# Patient Record
Sex: Female | Born: 1960 | Race: White | Hispanic: No | Marital: Married | Smoking: Never smoker
Health system: Southern US, Community
[De-identification: ages and names within clinical notes are randomized; demographics above are authoritative.]

## PROBLEM LIST (undated history)

## (undated) DIAGNOSIS — J45909 Unspecified asthma, uncomplicated: Secondary | ICD-10-CM

## (undated) HISTORY — DX: Unspecified asthma, uncomplicated: J45.909

---

## 1998-08-04 ENCOUNTER — Ambulatory Visit (HOSPITAL_COMMUNITY): Admission: RE | Admit: 1998-08-04 | Discharge: 1998-08-04 | Payer: Self-pay | Admitting: Physician Assistant

## 1999-04-18 ENCOUNTER — Other Ambulatory Visit: Admission: RE | Admit: 1999-04-18 | Discharge: 1999-04-18 | Payer: Self-pay | Admitting: Obstetrics and Gynecology

## 1999-10-02 ENCOUNTER — Encounter: Payer: Self-pay | Admitting: Obstetrics and Gynecology

## 1999-10-02 ENCOUNTER — Inpatient Hospital Stay (HOSPITAL_COMMUNITY): Admission: AD | Admit: 1999-10-02 | Discharge: 1999-10-02 | Payer: Self-pay | Admitting: Obstetrics and Gynecology

## 1999-10-20 ENCOUNTER — Inpatient Hospital Stay (HOSPITAL_COMMUNITY): Admission: AD | Admit: 1999-10-20 | Discharge: 1999-10-22 | Payer: Self-pay | Admitting: Obstetrics and Gynecology

## 1999-10-24 ENCOUNTER — Encounter: Admission: RE | Admit: 1999-10-24 | Discharge: 2000-01-22 | Payer: Self-pay | Admitting: Obstetrics and Gynecology

## 1999-12-06 ENCOUNTER — Other Ambulatory Visit: Admission: RE | Admit: 1999-12-06 | Discharge: 1999-12-06 | Payer: Self-pay | Admitting: Obstetrics and Gynecology

## 2000-11-22 ENCOUNTER — Ambulatory Visit (HOSPITAL_COMMUNITY): Admission: RE | Admit: 2000-11-22 | Discharge: 2000-11-22 | Payer: Self-pay | Admitting: Family Medicine

## 2000-11-22 ENCOUNTER — Encounter: Payer: Self-pay | Admitting: Family Medicine

## 2002-06-11 ENCOUNTER — Ambulatory Visit (HOSPITAL_COMMUNITY): Admission: RE | Admit: 2002-06-11 | Discharge: 2002-06-11 | Payer: Self-pay | Admitting: Family Medicine

## 2002-06-11 ENCOUNTER — Encounter: Payer: Self-pay | Admitting: Family Medicine

## 2003-06-16 ENCOUNTER — Ambulatory Visit (HOSPITAL_COMMUNITY): Admission: RE | Admit: 2003-06-16 | Discharge: 2003-06-16 | Payer: Self-pay | Admitting: Family Medicine

## 2003-08-04 ENCOUNTER — Other Ambulatory Visit: Admission: RE | Admit: 2003-08-04 | Discharge: 2003-08-04 | Payer: Self-pay | Admitting: Obstetrics and Gynecology

## 2004-08-26 ENCOUNTER — Ambulatory Visit (HOSPITAL_COMMUNITY): Admission: RE | Admit: 2004-08-26 | Discharge: 2004-08-26 | Payer: Self-pay | Admitting: Family Medicine

## 2004-10-14 ENCOUNTER — Other Ambulatory Visit: Admission: RE | Admit: 2004-10-14 | Discharge: 2004-10-14 | Payer: Self-pay | Admitting: Obstetrics and Gynecology

## 2005-10-17 ENCOUNTER — Other Ambulatory Visit: Admission: RE | Admit: 2005-10-17 | Discharge: 2005-10-17 | Payer: Self-pay | Admitting: Obstetrics and Gynecology

## 2007-10-03 IMAGING — US MAMMO-LUNI-US
1 series · 12 of 12 positions shown · non-contrast
Comparison: NONE

CLINICAL DATA: PUENAYAN, RT(R)(M)(CT)  Diagnostic 
mammogram.  

LEFT BREAST MAMMOGRAM (ADDITIONAL VIEWS) AND LEFT BREAST 
ULTRASOUND

[Series 1: us breast · 0.06mm/px · 12 of 12 slices shown]
[im 1/12]
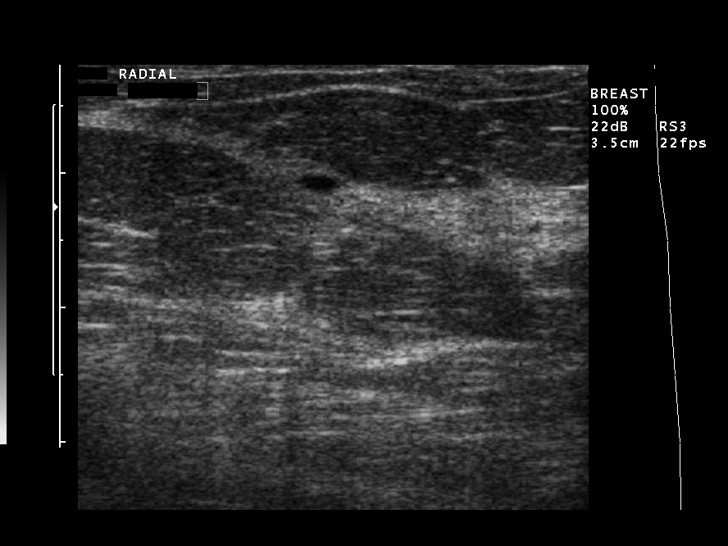
[im 2/12]
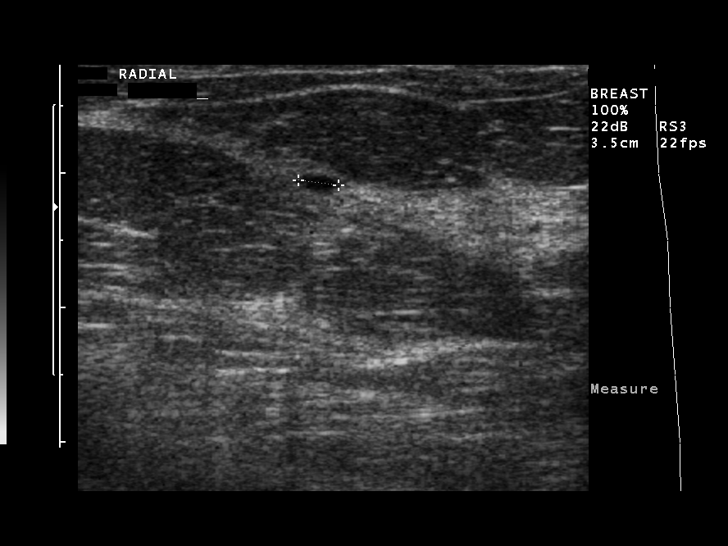
[im 3/12]
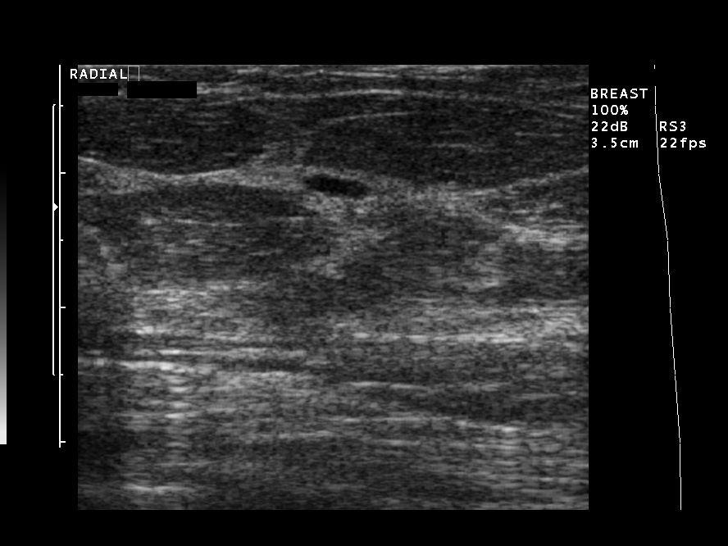
[im 4/12]
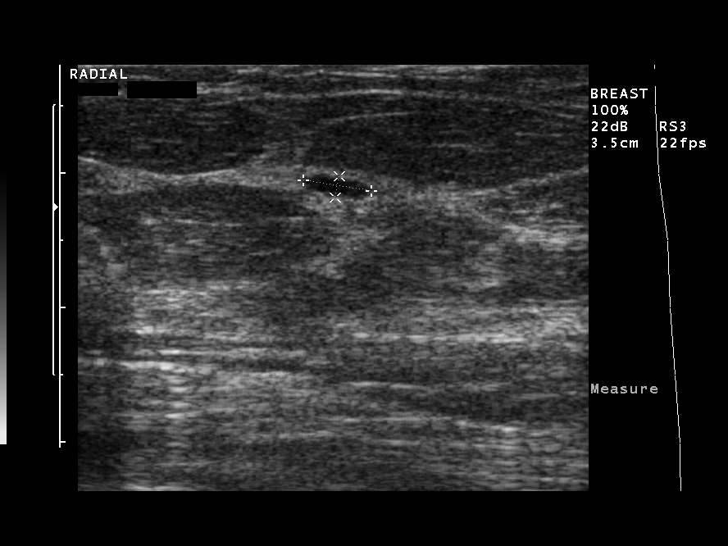
[im 5/12]
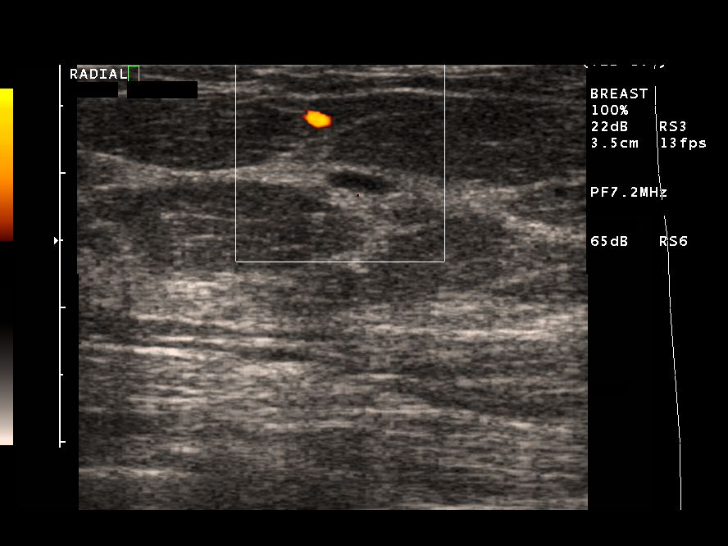
[im 6/12]
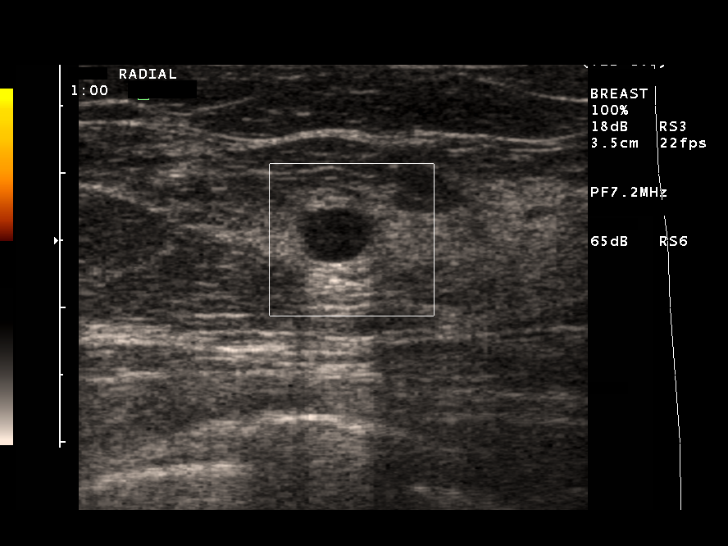
[im 7/12]
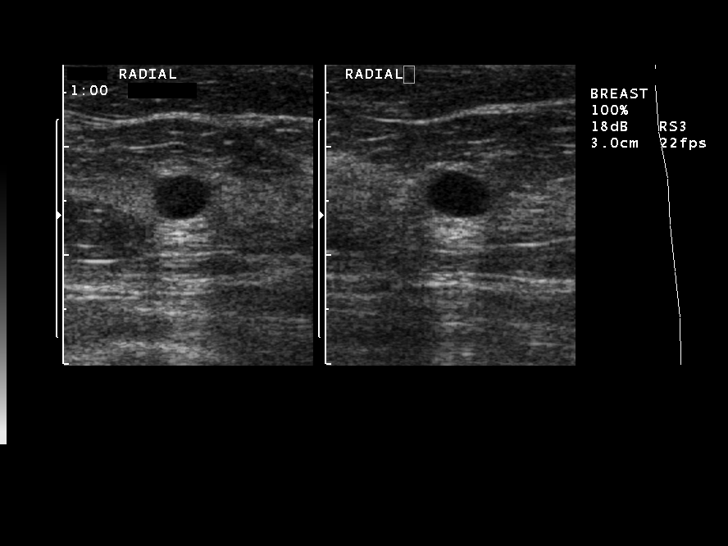
[im 8/12]
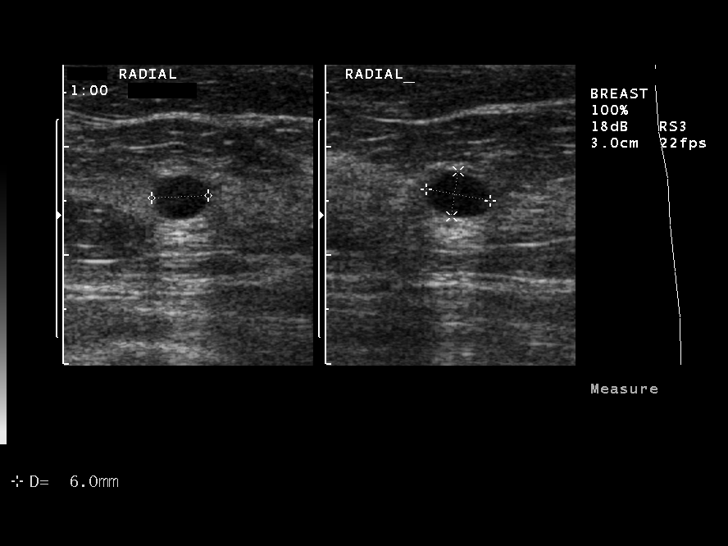
[im 9/12]
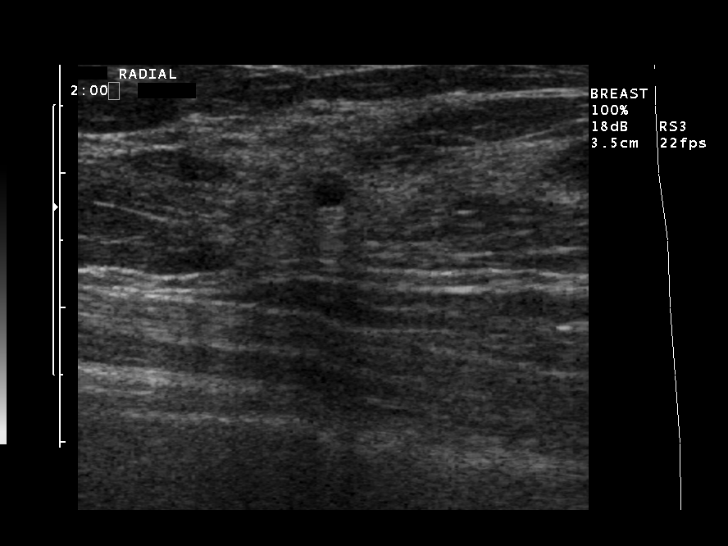
[im 10/12]
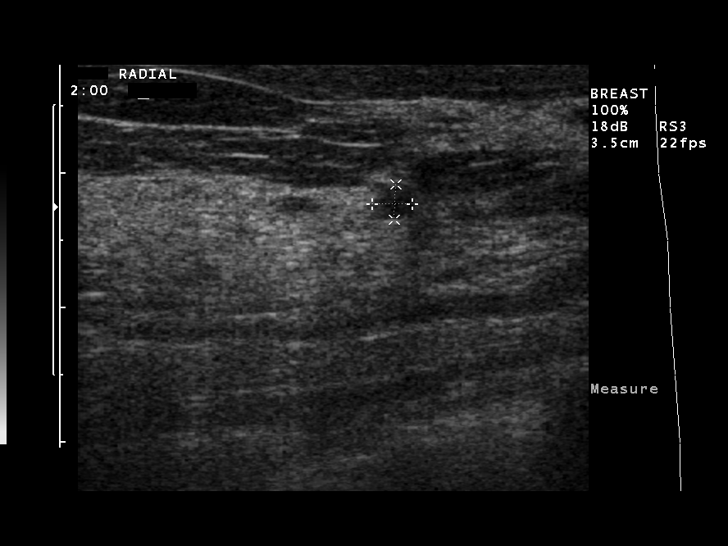
[im 11/12]
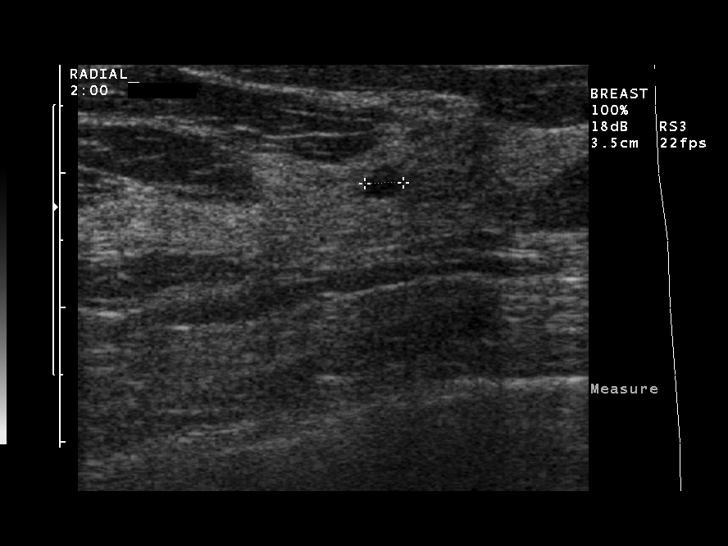
[im 12/12]
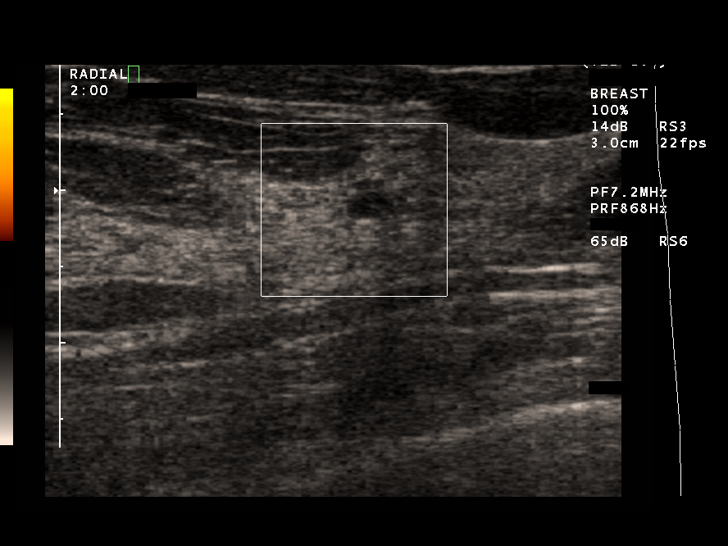

[12 of 12 positions shown; findings below may reference images not displayed]

FINDINGS: Repeat left MLO view and true lateral view of the left 
breast demonstrate persistent faint nodular density in the upper 
quadrant of the left breast. This area is not clearly visualized 
in the CC view but is most likely lateral. Left breast ultrasound 
demonstrates a cyst in the 1 o???clock position, subcentimeter in 
size. This likely corresponds to the mammographic density. No 
other findings.
IMPRESSION: Benign-appearing changes in the left breast. 
Bilateral mammogram in 1 year is recommended. The patient was 
informed at the time of the examination of the findings and 
recommendations by verbal and written lay report. Computer 
assisted (Second Look) technology was used as an aid in 
interpretation of this study. BI-RADS 2- Benign PUENAYAN Date:  [DATE] DAS  [REDACTED]

## 2010-10-06 ENCOUNTER — Other Ambulatory Visit: Payer: Self-pay | Admitting: Obstetrics and Gynecology

## 2010-10-13 ENCOUNTER — Other Ambulatory Visit: Payer: Self-pay | Admitting: Obstetrics and Gynecology

## 2010-10-13 ENCOUNTER — Ambulatory Visit
Admission: RE | Admit: 2010-10-13 | Discharge: 2010-10-13 | Disposition: A | Discharge status: Home / Self Care | Payer: BC Managed Care – PPO | Source: Ambulatory Visit | Attending: Obstetrics and Gynecology | Admitting: Obstetrics and Gynecology

## 2011-09-25 ENCOUNTER — Other Ambulatory Visit: Payer: Self-pay | Admitting: Obstetrics and Gynecology

## 2011-09-25 DIAGNOSIS — Z1231 Encounter for screening mammogram for malignant neoplasm of breast: Secondary | ICD-10-CM

## 2011-10-17 ENCOUNTER — Ambulatory Visit: Payer: BC Managed Care – PPO

## 2013-03-06 ENCOUNTER — Ambulatory Visit (INDEPENDENT_AMBULATORY_CARE_PROVIDER_SITE_OTHER): Payer: BC Managed Care – PPO | Admitting: Internal Medicine

## 2013-03-06 ENCOUNTER — Encounter: Payer: Self-pay | Admitting: Internal Medicine

## 2013-03-06 VITALS — BP 110/72 | HR 55 | Temp 98.2°F | Ht <= 58 in | Wt 144.0 lb

## 2013-03-06 DIAGNOSIS — R059 Cough, unspecified: Secondary | ICD-10-CM | POA: Insufficient documentation

## 2013-03-06 DIAGNOSIS — R05 Cough: Secondary | ICD-10-CM

## 2013-03-06 MED ORDER — PANTOPRAZOLE SODIUM 40 MG PO TBEC: 40.0000 mg | DELAYED_RELEASE_TABLET | Freq: Every day | ORAL | Status: DC

## 2013-03-06 MED ORDER — BENZONATATE 200 MG PO CAPS: ORAL_CAPSULE | ORAL | Status: DC

## 2013-03-06 NOTE — Progress Notes (Signed)
  Subjective:    Patient ID: Beth Villanueva, female    DOB: 1961/01/31  MRN: [id number]  HPI  52 yo arabic female never smoker  Born in  [place] but not exp to bad air moved to [place] where in 1993 dx with asthma intermittently with wheezing and chest tightness  and continued to have similar symptoms p arrival in [place] in 1994 mostly with cats and pollen never eval by specilast rx with saba/singulair referred to pulmonary clinic 03/06/2013 by Dr Laurann Montana for intermittent refractory cough since 05/2012   03/06/2013 1st pulmonary ov / Beth Villanueva cc cough x 2 months fall of 2013 no resp to pred ? 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  Uses proair 2-3 x per week for chest tightness but no help for the cough which is dry any almost entirely daytime and not assoc with sob or wheeze.  Can cough so hard begins having midline  Ant cp resolves when stops coughing.   Already on singulair, inhalers and even prednisone not helpful.   No obvious other pattern to daytime variabilty or assoc overt sinus or hb symptoms. No unusual exp hx or h/o childhood pna/ asthma or knowledge of premature birth.   Sleeping ok without nocturnal  or early am exacerbation  of respiratory  c/o's or need for noct saba. Also denies any obvious fluctuation of symptoms with weather or environmental changes or other aggravating or alleviating factors except as outlined above   Review of Systems  Constitutional: Negative for fever, chills and unexpected weight change.  HENT: Negative for ear pain, nosebleeds, congestion, sore throat, rhinorrhea, sneezing, trouble swallowing, dental problem, voice change, postnasal drip and sinus pressure.   Eyes: Negative for visual disturbance.  Respiratory: Positive for cough. Negative for choking and shortness of breath.   Cardiovascular: Negative for chest pain and leg swelling.   Gastrointestinal: Negative for vomiting, abdominal pain and diarrhea.  Genitourinary: Negative for difficulty urinating.  Musculoskeletal: Negative for arthralgias.  Skin: Negative for rash.  Neurological: Negative for tremors, syncope and headaches.  Hematological: Does not bruise/bleed easily.       Objective:   Physical Exam Wt Readings from Last 3 Encounters:  03/06/13 144 lb (65.318 kg)    amb arabic female nad HEENT: nl dentition, turbinates, and orophanx. Nl external ear canals with cough reflex   NECK :  without JVD/Nodes/TM/ nl carotid upstrokes bilaterally   LUNGS: no acc muscle use, clear to A and P bilaterally without cough on insp or exp maneuvers   CV:  RRR  no s3 or murmur or increase in P2, no edema   ABD:  soft and nontender with nl excursion in the supine position. No bruits or organomegaly, bowel sounds nl  MS:  warm without deformities, calf tenderness, cyanosis or clubbing  SKIN: warm and dry without lesions    NEURO:  alert, approp, no deficits   cxr > none on file      Assessment & Plan:

## 2013-03-06 NOTE — Patient Instructions (Addendum)
Pantoprazole (protonix) 40 mg   Take 30-60 min before first meal of the day for at least a month  For cough use tessilon 200 mg every 4 hours   GERD (REFLUX)  is an extremely common cause of respiratory symptoms, many times with no significant heartburn at all.    It can be treated with medication, but also with lifestyle changes including avoidance of late meals, excessive alcohol, smoking cessation, and avoid fatty foods, chocolate, peppermint, colas, red wine, and acidic juices such as orange juice.  NO MINT OR MENTHOL PRODUCTS SO NO COUGH DROPS  USE SUGARLESS CANDY INSTEAD (jolley ranchers or Stover's)  NO OIL BASED VITAMINS - use powdered substitutes.  If not 100% better in one month we need to see you back for cxr

## 2013-03-08 NOTE — Assessment & Plan Note (Addendum)
The most common causes of chronic cough in immunocompetent adults include the following: upper airway cough syndrome (UACS), previously referred to as postnasal drip syndrome (PNDS), which is caused by variety of rhinosinus conditions; (2) asthma; (3) GERD; (4) chronic bronchitis from cigarette smoking or other inhaled environmental irritants; (5) nonasthmatic eosinophilic bronchitis; and (6) bronchiectasis.   These conditions, singly or in combination, have accounted for up to 94% of the causes of chronic cough in prospective studies.   Other conditions have constituted no >6% of the causes in prospective studies These have included bronchogenic carcinoma, chronic interstitial pneumonia, sarcoidosis, left ventricular failure, ACEI-induced cough, and aspiration from a condition associated with pharyngeal dysfunction.    Chronic cough is often simultaneously caused by more than one condition. A single cause has been found from 38 to 82% of the time, multiple causes from 18 to 62%. Multiply caused cough has been the result of three diseases up to 42% of the time.       This is almost certainly  Classic Upper airway cough syndrome, so named because it's frequently impossible to sort out how much is  CR/sinusitis with freq throat clearing (which can be related to primary GERD)   vs  causing  secondary (" extra esophageal")  GERD from wide swings in gastric pressure that occur with throat clearing, often  promoting self use of mint and menthol lozenges that reduce the lower esophageal sphincter tone and exacerbate the problem further in a cyclical fashion.   These are the same pts (now being labeled as having "irritable larynx syndrome" by some cough centers) who not infrequently have a history of having failed to tolerate ace inhibitors,  dry powder inhalers or biphosphonates or report having atypical reflux symptoms that don't respond to standard doses of PPI , and are easily confused as having aecopd or  asthma flares by even experienced allergists/ pulmonologists.   For now try max gerd rx with both diet and ppi  x one month then regroup.

## 2013-11-06 ENCOUNTER — Other Ambulatory Visit: Payer: Self-pay

## 2013-11-06 DIAGNOSIS — Z1231 Encounter for screening mammogram for malignant neoplasm of breast: Secondary | ICD-10-CM

## 2013-11-25 ENCOUNTER — Ambulatory Visit
Admission: RE | Admit: 2013-11-25 | Discharge: 2013-11-25 | Disposition: A | Discharge status: Home / Self Care | Payer: BC Managed Care – PPO | Source: Ambulatory Visit

## 2013-11-25 DIAGNOSIS — Z1231 Encounter for screening mammogram for malignant neoplasm of breast: Secondary | ICD-10-CM

## 2014-12-08 ENCOUNTER — Other Ambulatory Visit: Payer: Self-pay

## 2014-12-08 DIAGNOSIS — Z1231 Encounter for screening mammogram for malignant neoplasm of breast: Secondary | ICD-10-CM

## 2014-12-10 ENCOUNTER — Ambulatory Visit
Admission: RE | Admit: 2014-12-10 | Discharge: 2014-12-10 | Disposition: A | Discharge status: Home / Self Care | Payer: BC Managed Care – PPO | Source: Ambulatory Visit

## 2014-12-10 DIAGNOSIS — Z1231 Encounter for screening mammogram for malignant neoplasm of breast: Secondary | ICD-10-CM

## 2015-03-16 ENCOUNTER — Other Ambulatory Visit (HOSPITAL_COMMUNITY)
Admission: RE | Admit: 2015-03-16 | Discharge: 2015-03-16 | Disposition: A | Discharge status: Home / Self Care | Payer: BC Managed Care – PPO | Source: Ambulatory Visit | Attending: Family Medicine | Admitting: Family Medicine

## 2015-03-16 ENCOUNTER — Other Ambulatory Visit: Payer: Self-pay | Admitting: Family Medicine

## 2015-03-16 DIAGNOSIS — Z01419 Encounter for gynecological examination (general) (routine) without abnormal findings: Secondary | ICD-10-CM | POA: Insufficient documentation

## 2015-03-18 LAB — CYTOLOGY - PAP

## 2015-11-18 ENCOUNTER — Ambulatory Visit (INDEPENDENT_AMBULATORY_CARE_PROVIDER_SITE_OTHER): Payer: BC Managed Care – PPO | Admitting: Podiatry

## 2015-11-18 ENCOUNTER — Other Ambulatory Visit: Payer: Self-pay

## 2015-11-18 ENCOUNTER — Encounter: Payer: Self-pay | Admitting: Podiatry

## 2015-11-18 VITALS — BP 128/80 | HR 58 | Ht <= 58 in | Wt 158.0 lb

## 2015-11-18 DIAGNOSIS — M205X9 Other deformities of toe(s) (acquired), unspecified foot: Secondary | ICD-10-CM | POA: Diagnosis not present

## 2015-11-18 DIAGNOSIS — Q828 Other specified congenital malformations of skin: Secondary | ICD-10-CM | POA: Diagnosis not present

## 2015-11-18 DIAGNOSIS — M205X1 Other deformities of toe(s) (acquired), right foot: Secondary | ICD-10-CM

## 2015-11-18 DIAGNOSIS — M659 Synovitis and tenosynovitis, unspecified: Secondary | ICD-10-CM

## 2015-11-18 DIAGNOSIS — L923 Foreign body granuloma of the skin and subcutaneous tissue: Secondary | ICD-10-CM

## 2015-11-18 DIAGNOSIS — M216X9 Other acquired deformities of unspecified foot: Secondary | ICD-10-CM

## 2015-11-18 DIAGNOSIS — M21969 Unspecified acquired deformity of unspecified lower leg: Secondary | ICD-10-CM | POA: Insufficient documentation

## 2015-11-18 NOTE — Patient Instructions (Signed)
Seen for pain under right foot with callus like lesion, which was debrided. Will monitor its progress. Left foot pain on top is due to weakened first Metatarsal bone with bunion. Metatarsal binder will help some for now. Will benefit from Custom orthotics. Both feet casted for Orthotics.

## 2015-11-18 NOTE — Progress Notes (Signed)
Lesion under the 2nd MPJ right for a few months.   Pain on top of left foot, tip of right, lesion under 2nd MPJ.  Debrided. Need Orthotics. Met binder dispensed for left. SUBJECTIVE: 55 y.o. year old female presents complaining of a painful lesion under the ball of right foot for a few months and numbness on tip of the left great toe.  Also having pain on top of left foot.  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   REVIEW OF SYSTEMS: A comprehensive review of systems was negative.  OBJECTIVE: DERMATOLOGIC EXAMINATION: Positive of porokeratotic lesion symptomatic under 2nd MPJ right foot. VASCULAR EXAMINATION OF LOWER LIMBS: Pedal pulses: All pedal pulses are palpable with normal pulsation. No visible edema or erythema noted over the affected area of dorsum left foot. NEUROLOGIC EXAMINATION OF THE LOWER LIMBS: All epicritic and tactile sensations grossly intact.  MUSCULOSKELETAL EXAMINATION: Positive for Hallux valgus with bunion on left. History of bunion surgery right foot.  Positive for excess sagittal plane motion of the first ray left. Positive of hallux limitus right upon loading of forefoot.  RADIOGRAPHIC STUDIES:  AP View:  Post bunionectomy done on right. Enlarged medial eminence of first metatarsal with hallux valgus on left. Lateral view:  Elevated first ray on left. Minimum elevation on right. Normal CYMA line and minimum deviation of CCJ bilateral.   ASSESSMENT: 1. Symptomatic porokeratosis sub 2 right, possible due to foreign body granuloma. 2. 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 3. Functional hallux limitus right, with numbness on top of right great toe. 4. Tenosynovitis midfoot left secondary to weak first ray.   PLAN: Reviewed clinical findings and available treatment options. Right foot lesion debrided. Left foot placed in Metatarsal binder with instruction. Both feet casted for orthotics.

## 2015-12-09 ENCOUNTER — Other Ambulatory Visit: Payer: Self-pay

## 2015-12-09 DIAGNOSIS — Z1231 Encounter for screening mammogram for malignant neoplasm of breast: Secondary | ICD-10-CM

## 2015-12-21 ENCOUNTER — Ambulatory Visit
Admission: RE | Admit: 2015-12-21 | Discharge: 2015-12-21 | Disposition: A | Discharge status: Home / Self Care | Payer: BC Managed Care – PPO | Source: Ambulatory Visit

## 2015-12-21 DIAGNOSIS — Z1231 Encounter for screening mammogram for malignant neoplasm of breast: Secondary | ICD-10-CM

## 2016-01-18 ENCOUNTER — Ambulatory Visit (INDEPENDENT_AMBULATORY_CARE_PROVIDER_SITE_OTHER): Payer: BC Managed Care – PPO | Admitting: Podiatry

## 2016-01-18 ENCOUNTER — Encounter: Payer: Self-pay | Admitting: Podiatry

## 2016-01-18 DIAGNOSIS — M79673 Pain in unspecified foot: Secondary | ICD-10-CM

## 2016-01-18 DIAGNOSIS — M79604 Pain in right leg: Secondary | ICD-10-CM

## 2016-01-18 DIAGNOSIS — Q828 Other specified congenital malformations of skin: Secondary | ICD-10-CM

## 2016-01-18 NOTE — Progress Notes (Signed)
Subjective: One month orthotic check. Orthotics are doing good. Pain in left foot dorsum is gone. Right foot heel has a lesion.   OBJECTIVE: DERMATOLOGIC EXAMINATION: Resolved pain from 2nd MPJ plantar lesion right foot. New pain from under the right plantar medial heel with porokeratotic lesion.  VASCULAR EXAMINATION OF LOWER LIMBS: Pedal pulses: All pedal pulses are palpable with normal pulsation. No visible edema or erythema noted over the affected area of dorsum left foot. NEUROLOGIC EXAMINATION OF THE LOWER LIMBS: All epicritic and tactile sensations grossly intact.  MUSCULOSKELETAL EXAMINATION: Positive for Hallux valgus with bunion on left. History of bunion surgery right foot.  Positive for excess sagittal plane motion of the first ray left. Positive of hallux limitus right upon loading of forefoot.  Previous X-ray study results are:  MV:HQIOAP:Post bunionectomy done on right. Enlarged medial eminence of first metatarsal with hallux valgus on left. Lateral view:  Elevated first ray on left. Minimum elevation on right. Normal CYMA line and minimum deviation of CCJ bilateral.   ASSESSMENT: 1. Symptomatic porokeratosis sub 2 right, resolved with debridement. 2. 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 3. Functional hallux limitus right, with numbness on top of right great toe. 4. Tenosynovitis midfoot left secondary to weak first ray, resolved through Orthotic treatment.  5. New painful porokeratotic lesion under right plantar medial heel.   Plan: Debrided painful lesion right heel.  Continue with Orthotic treatment.  Return as needed.

## 2016-01-18 NOTE — Patient Instructions (Signed)
Orthotic check and doing well. Has painful porokeratotic lesion on right heel. Debrided painful lesion. Return as needed.

## 2016-06-16 ENCOUNTER — Other Ambulatory Visit: Payer: Self-pay | Admitting: Obstetrics and Gynecology

## 2016-06-19 LAB — CYTOLOGY - PAP

## 2017-01-25 ENCOUNTER — Other Ambulatory Visit: Payer: Self-pay | Admitting: Obstetrics and Gynecology

## 2017-01-25 DIAGNOSIS — Z1231 Encounter for screening mammogram for malignant neoplasm of breast: Secondary | ICD-10-CM

## 2017-02-09 ENCOUNTER — Ambulatory Visit
Admission: RE | Admit: 2017-02-09 | Discharge: 2017-02-09 | Disposition: A | Discharge status: Home / Self Care | Payer: BC Managed Care – PPO | Source: Ambulatory Visit | Attending: Obstetrics and Gynecology | Admitting: Obstetrics and Gynecology

## 2017-02-09 DIAGNOSIS — Z1231 Encounter for screening mammogram for malignant neoplasm of breast: Secondary | ICD-10-CM

## 2017-12-31 ENCOUNTER — Other Ambulatory Visit: Payer: Self-pay | Admitting: Obstetrics and Gynecology

## 2017-12-31 DIAGNOSIS — Z1231 Encounter for screening mammogram for malignant neoplasm of breast: Secondary | ICD-10-CM

## 2018-02-11 ENCOUNTER — Ambulatory Visit
Admission: RE | Admit: 2018-02-11 | Discharge: 2018-02-11 | Disposition: A | Discharge status: Home / Self Care | Payer: BC Managed Care – PPO | Source: Ambulatory Visit | Attending: Obstetrics and Gynecology | Admitting: Obstetrics and Gynecology

## 2018-02-11 DIAGNOSIS — Z1231 Encounter for screening mammogram for malignant neoplasm of breast: Secondary | ICD-10-CM

## 2019-02-04 ENCOUNTER — Other Ambulatory Visit: Payer: Self-pay | Admitting: Obstetrics and Gynecology

## 2019-02-04 DIAGNOSIS — Z1231 Encounter for screening mammogram for malignant neoplasm of breast: Secondary | ICD-10-CM

## 2019-03-21 ENCOUNTER — Ambulatory Visit
Admission: RE | Admit: 2019-03-21 | Discharge: 2019-03-21 | Disposition: A | Discharge status: Home / Self Care | Payer: BC Managed Care – PPO | Source: Ambulatory Visit | Attending: Obstetrics and Gynecology | Admitting: Obstetrics and Gynecology

## 2019-03-21 ENCOUNTER — Other Ambulatory Visit: Payer: Self-pay

## 2019-03-21 ENCOUNTER — Other Ambulatory Visit: Payer: Self-pay | Admitting: Family Medicine

## 2019-03-21 DIAGNOSIS — M8580 Other specified disorders of bone density and structure, unspecified site: Secondary | ICD-10-CM

## 2019-03-21 DIAGNOSIS — Z1231 Encounter for screening mammogram for malignant neoplasm of breast: Secondary | ICD-10-CM

## 2019-06-13 ENCOUNTER — Ambulatory Visit
Admission: RE | Admit: 2019-06-13 | Discharge: 2019-06-13 | Disposition: A | Discharge status: Home / Self Care | Payer: BC Managed Care – PPO | Source: Ambulatory Visit | Attending: Family Medicine | Admitting: Family Medicine

## 2019-06-13 ENCOUNTER — Other Ambulatory Visit: Payer: Self-pay

## 2019-06-13 DIAGNOSIS — M8580 Other specified disorders of bone density and structure, unspecified site: Secondary | ICD-10-CM

## 2020-02-10 ENCOUNTER — Other Ambulatory Visit: Payer: Self-pay | Admitting: Obstetrics and Gynecology

## 2020-02-10 DIAGNOSIS — Z1231 Encounter for screening mammogram for malignant neoplasm of breast: Secondary | ICD-10-CM

## 2020-03-23 ENCOUNTER — Ambulatory Visit
Admission: RE | Admit: 2020-03-23 | Discharge: 2020-03-23 | Disposition: A | Discharge status: Home / Self Care | Payer: BC Managed Care – PPO | Source: Ambulatory Visit | Attending: Obstetrics and Gynecology | Admitting: Obstetrics and Gynecology

## 2020-03-23 ENCOUNTER — Other Ambulatory Visit: Payer: Self-pay

## 2020-03-23 DIAGNOSIS — Z1231 Encounter for screening mammogram for malignant neoplasm of breast: Secondary | ICD-10-CM

## 2021-02-10 ENCOUNTER — Other Ambulatory Visit: Payer: Self-pay | Admitting: Obstetrics and Gynecology

## 2021-02-10 DIAGNOSIS — Z1231 Encounter for screening mammogram for malignant neoplasm of breast: Secondary | ICD-10-CM

## 2021-03-29 ENCOUNTER — Other Ambulatory Visit: Payer: Self-pay | Admitting: Family Medicine

## 2021-03-29 DIAGNOSIS — M8580 Other specified disorders of bone density and structure, unspecified site: Secondary | ICD-10-CM

## 2021-04-05 ENCOUNTER — Ambulatory Visit
Admission: RE | Admit: 2021-04-05 | Discharge: 2021-04-05 | Disposition: A | Discharge status: Home / Self Care | Payer: BC Managed Care – PPO | Source: Ambulatory Visit | Attending: Obstetrics and Gynecology | Admitting: Obstetrics and Gynecology

## 2021-04-05 ENCOUNTER — Other Ambulatory Visit: Payer: Self-pay

## 2021-04-05 DIAGNOSIS — Z1231 Encounter for screening mammogram for malignant neoplasm of breast: Secondary | ICD-10-CM

## 2021-09-27 ENCOUNTER — Ambulatory Visit
Admission: RE | Admit: 2021-09-27 | Discharge: 2021-09-27 | Disposition: A | Discharge status: Home / Self Care | Payer: BC Managed Care – PPO | Source: Ambulatory Visit | Attending: Family Medicine | Admitting: Family Medicine

## 2021-09-27 DIAGNOSIS — M8580 Other specified disorders of bone density and structure, unspecified site: Secondary | ICD-10-CM

## 2021-12-22 ENCOUNTER — Other Ambulatory Visit: Payer: Self-pay | Admitting: Obstetrics and Gynecology

## 2021-12-22 DIAGNOSIS — Z1231 Encounter for screening mammogram for malignant neoplasm of breast: Secondary | ICD-10-CM

## 2022-04-10 ENCOUNTER — Ambulatory Visit
Admission: RE | Admit: 2022-04-10 | Discharge: 2022-04-10 | Disposition: A | Discharge status: Home / Self Care | Payer: BC Managed Care – PPO | Source: Ambulatory Visit | Attending: Obstetrics and Gynecology | Admitting: Obstetrics and Gynecology

## 2022-04-10 DIAGNOSIS — Z1231 Encounter for screening mammogram for malignant neoplasm of breast: Secondary | ICD-10-CM

## 2022-09-27 ENCOUNTER — Ambulatory Visit
Admission: RE | Admit: 2022-09-27 | Discharge: 2022-09-27 | Disposition: A | Discharge status: Home / Self Care | Payer: BC Managed Care – PPO | Source: Ambulatory Visit | Attending: Family Medicine | Admitting: Family Medicine

## 2022-09-27 ENCOUNTER — Other Ambulatory Visit: Payer: Self-pay | Admitting: Family Medicine

## 2022-09-27 DIAGNOSIS — R052 Subacute cough: Secondary | ICD-10-CM

## 2022-09-29 ENCOUNTER — Ambulatory Visit
Admission: RE | Admit: 2022-09-29 | Discharge: 2022-09-29 | Disposition: A | Discharge status: Home / Self Care | Payer: BC Managed Care – PPO | Source: Ambulatory Visit | Attending: Family Medicine | Admitting: Family Medicine

## 2024-03-07 ENCOUNTER — Encounter: Payer: Self-pay | Admitting: Advanced Practice Midwife

## 2024-05-20 ENCOUNTER — Other Ambulatory Visit (HOSPITAL_BASED_OUTPATIENT_CLINIC_OR_DEPARTMENT_OTHER): Payer: Self-pay | Admitting: Family Medicine

## 2024-05-20 DIAGNOSIS — M8580 Other specified disorders of bone density and structure, unspecified site: Secondary | ICD-10-CM

## 2024-06-23 ENCOUNTER — Ambulatory Visit (HOSPITAL_BASED_OUTPATIENT_CLINIC_OR_DEPARTMENT_OTHER)
Admission: RE | Admit: 2024-06-23 | Discharge: 2024-06-23 | Disposition: A | Discharge status: Home / Self Care | Payer: Self-pay | Source: Ambulatory Visit | Attending: Family Medicine | Admitting: Family Medicine

## 2024-06-23 DIAGNOSIS — M8580 Other specified disorders of bone density and structure, unspecified site: Secondary | ICD-10-CM | POA: Insufficient documentation
